# Patient Record
Sex: Female | Born: 1935 | Race: White | Hispanic: No | Marital: Married | Smoking: Never smoker
Health system: Southern US, Community
[De-identification: ages and names within clinical notes are randomized; demographics above are authoritative.]

## PROBLEM LIST (undated history)

## (undated) DIAGNOSIS — E079 Disorder of thyroid, unspecified: Secondary | ICD-10-CM

## (undated) HISTORY — PX: TONSILLECTOMY: SUR1361

---

## 1998-07-18 ENCOUNTER — Ambulatory Visit (HOSPITAL_COMMUNITY): Admission: RE | Admit: 1998-07-18 | Discharge: 1998-07-18 | Payer: Self-pay | Admitting: Dentistry

## 2001-04-12 ENCOUNTER — Other Ambulatory Visit: Admission: RE | Admit: 2001-04-12 | Discharge: 2001-04-12 | Payer: Self-pay | Admitting: Dermatology

## 2016-05-22 ENCOUNTER — Emergency Department (HOSPITAL_COMMUNITY): Payer: Medicare Other

## 2016-05-22 ENCOUNTER — Emergency Department (HOSPITAL_COMMUNITY)
Admission: EM | Admit: 2016-05-22 | Discharge: 2016-05-22 | Disposition: A | Discharge status: Home / Self Care | Payer: Medicare Other | Attending: Emergency Medicine | Admitting: Emergency Medicine

## 2016-05-22 ENCOUNTER — Encounter (HOSPITAL_COMMUNITY): Payer: Self-pay | Admitting: Emergency Medicine

## 2016-05-22 DIAGNOSIS — S060X0A Concussion without loss of consciousness, initial encounter: Secondary | ICD-10-CM | POA: Insufficient documentation

## 2016-05-22 DIAGNOSIS — Y929 Unspecified place or not applicable: Secondary | ICD-10-CM | POA: Diagnosis not present

## 2016-05-22 DIAGNOSIS — Z79899 Other long term (current) drug therapy: Secondary | ICD-10-CM | POA: Insufficient documentation

## 2016-05-22 DIAGNOSIS — W19XXXA Unspecified fall, initial encounter: Secondary | ICD-10-CM

## 2016-05-22 DIAGNOSIS — Y9389 Activity, other specified: Secondary | ICD-10-CM | POA: Diagnosis not present

## 2016-05-22 DIAGNOSIS — S0993XA Unspecified injury of face, initial encounter: Secondary | ICD-10-CM

## 2016-05-22 DIAGNOSIS — S0081XA Abrasion of other part of head, initial encounter: Secondary | ICD-10-CM | POA: Insufficient documentation

## 2016-05-22 DIAGNOSIS — S0990XA Unspecified injury of head, initial encounter: Secondary | ICD-10-CM | POA: Diagnosis present

## 2016-05-22 DIAGNOSIS — Y999 Unspecified external cause status: Secondary | ICD-10-CM | POA: Insufficient documentation

## 2016-05-22 DIAGNOSIS — W0110XA Fall on same level from slipping, tripping and stumbling with subsequent striking against unspecified object, initial encounter: Secondary | ICD-10-CM | POA: Insufficient documentation

## 2016-05-22 HISTORY — DX: Disorder of thyroid, unspecified: E07.9

## 2016-05-22 NOTE — ED Provider Notes (Signed)
CSN: [id number]     Arrival date & time 05/22/16  1816 History   First MD Initiated Contact with Patient 05/22/16 1819     Chief Complaint  Patient presents with  . Fall     Patient is a 82 y.o. female presenting with fall. The history is provided by the patient and a caregiver.  Fall This is a new problem. The current episode started 1 to 2 hours ago. The problem occurs constantly. The problem has been gradually improving. Associated symptoms include headaches. Pertinent negatives include no chest pain, no abdominal pain and no shortness of breath. Nothing aggravates the symptoms. Nothing relieves the symptoms.  PATIENT REPORTS SHE WAS OUTSIDE CHECKING RAIN METER WHEN SHE TRIPPED AND FELL HITTING LEFT SIDE OF FACE NO LOC SHE REPORTS FACIAL PAIN AND SOME HEADACHE NO VOMITING NO NECK OR BACK PAIN SHE REPORTS ABRASION TO LEFT SHOULDER AND LEFT WRIST NO OTHER PAIN COMPLAINTS SHE IS NOT ON ANTICOAGULANTS   Past Medical History  Diagnosis Date  . Thyroid disease    Past Surgical History  Procedure Laterality Date  . Tonsillectomy     No family history on file. Social History  Substance Use Topics  . Smoking status: Never Smoker   . Smokeless tobacco: None  . Alcohol Use: No   OB History    No data available     Review of Systems  Constitutional: Negative for fever and fatigue.  Respiratory: Negative for shortness of breath.   Cardiovascular: Negative for chest pain.  Gastrointestinal: Negative for abdominal pain.  Skin: Positive for wound.  Neurological: Positive for headaches.  All other systems reviewed and are negative.     Allergies  Review of patient's allergies indicates no known allergies.  Home Medications   Prior to Admission medications   Medication Sig Start Date End Date Taking? Authorizing Provider  levothyroxine (SYNTHROID, LEVOTHROID) 75 MCG tablet Take 75 mcg by mouth daily. 03/11/16  Yes Historical Provider, MD  OXYGEN Inhale 2 L into the lungs  at bedtime.   Yes Historical Provider, MD  Polyethyl Glycol-Propyl Glycol (SYSTANE) 0.4-0.3 % SOLN Place 1 drop into both eyes daily as needed. Dry eyes    Historical Provider, MD   BP 162/91 mmHg  Pulse 66  Temp(Src) 98.1 F (36.7 C)  Resp 18  Ht 5\' 3"  (1.6 m)  Wt 49.896 kg  BMI 19.49 kg/m2  SpO2 100% Physical Exam CONSTITUTIONAL: elderly but appears younger than stated age HEAD: Normocephalic/atraumatic EYES: EOMI/PERRL ENMT: Mucous membranes moist, abrasion/tenderness to left maxilla and left orbit.  No lacerations.  No stepoffs.  Mild swelling/abrasion to nose.  No septal hematoma.  No evidence of mandible injury.  No dental injury.  No stepoffs to face noted NECK: supple no meningeal signs SPINE/BACK:entire spine nontender CV: S1/S2 noted, no murmurs/rubs/gallops noted LUNGS: Lungs are clear to auscultation bilaterally ABDOMEN: soft, nontender NEURO: Pt is awake/alert/appropriate, moves all extremitiesx4.  No facial droop.  She ambulates without difficulty EXTREMITIES: pulses normal/equal, full ROM, abrasion to left shoulder and left wrist but no tenderness All other extremities/joints palpated/ranged and nontender SKIN: warm, color normal PSYCH: no abnormalities of mood noted, alert and oriented to situation  ED Course  Procedures  7:30 PM Pt declines tetanus booster 8:19 PM Ct imaging negative Pt awake/alert, no distress Will d/c home She lives alone but her caregivers check on her frequently and she is very independent  Imaging Review Ct Head Wo Contrast  05/22/2016  CLINICAL DATA:  Tripped and fell  in driveway, with left cheek pain and abrasion. Concern for head injury. Initial encounter. EXAM: CT HEAD WITHOUT CONTRAST CT MAXILLOFACIAL WITHOUT CONTRAST TECHNIQUE: Multidetector CT imaging of the head and maxillofacial structures were performed using the standard protocol without intravenous contrast. Multiplanar CT image reconstructions of the maxillofacial structures  were also generated. COMPARISON:  None. FINDINGS: CT HEAD FINDINGS There is no evidence of acute infarction, mass lesion, or intra- or extra-axial hemorrhage on CT. Prominence of the ventricles and sulci reflects mild to moderate cortical volume loss. Mild periventricular white matter change likely reflects small vessel ischemic microangiopathy. A small chronic lacunar infarct is noted at the right cerebellar hemisphere. The brainstem and fourth ventricle are within normal limits. The basal ganglia are unremarkable in appearance. The cerebral hemispheres demonstrate grossly normal gray-white differentiation. No mass effect or midline shift is seen. There is no evidence of fracture; visualized osseous structures are unremarkable in appearance. The orbits are within normal limits. The paranasal sinuses and mastoid air cells are well-aerated. No significant soft tissue abnormalities are seen. CT MAXILLOFACIAL FINDINGS There is no evidence of fracture or dislocation. The maxilla and mandible appear intact. The nasal bone is unremarkable in appearance. The visualized dentition demonstrates no acute abnormality. There is flattening of the mandibular condylar heads bilaterally, reflecting chronic temporomandibular joint disease. The orbits are intact bilaterally. The visualized paranasal sinuses and mastoid air cells are well-aerated. Minimal soft tissue swelling is noted overlying the left maxilla. The parapharyngeal fat planes are preserved. The nasopharynx, oropharynx and hypopharynx are unremarkable in appearance. The visualized portions of the valleculae and piriform sinuses are grossly unremarkable. The parotid and submandibular glands are within normal limits. No cervical lymphadenopathy is seen. IMPRESSION: 1. No evidence of traumatic intracranial injury or fracture. 2. No evidence of fracture or dislocation with regard to the maxillofacial structures. 3. Minimal soft tissue swelling overlying the left maxilla. 4.  Mild to moderate cortical volume loss and scattered small vessel ischemic microangiopathy. 5. Small chronic lacunar infarct at the right cerebellar hemisphere. 6. Flattening of the mandibular condylar heads bilaterally, reflecting chronic temporomandibular joint disease. Electronically Signed   By: Roanna Raider M.D.   On: 05/22/2016 20:15   Ct Maxillofacial Wo Cm  05/22/2016  CLINICAL DATA:  Tripped and fell in driveway, with left cheek pain and abrasion. Concern for head injury. Initial encounter. EXAM: CT HEAD WITHOUT CONTRAST CT MAXILLOFACIAL WITHOUT CONTRAST TECHNIQUE: Multidetector CT imaging of the head and maxillofacial structures were performed using the standard protocol without intravenous contrast. Multiplanar CT image reconstructions of the maxillofacial structures were also generated. COMPARISON:  None. FINDINGS: CT HEAD FINDINGS There is no evidence of acute infarction, mass lesion, or intra- or extra-axial hemorrhage on CT. Prominence of the ventricles and sulci reflects mild to moderate cortical volume loss. Mild periventricular white matter change likely reflects small vessel ischemic microangiopathy. A small chronic lacunar infarct is noted at the right cerebellar hemisphere. The brainstem and fourth ventricle are within normal limits. The basal ganglia are unremarkable in appearance. The cerebral hemispheres demonstrate grossly normal gray-white differentiation. No mass effect or midline shift is seen. There is no evidence of fracture; visualized osseous structures are unremarkable in appearance. The orbits are within normal limits. The paranasal sinuses and mastoid air cells are well-aerated. No significant soft tissue abnormalities are seen. CT MAXILLOFACIAL FINDINGS There is no evidence of fracture or dislocation. The maxilla and mandible appear intact. The nasal bone is unremarkable in appearance. The visualized dentition demonstrates no acute abnormality. There  is flattening of the  mandibular condylar heads bilaterally, reflecting chronic temporomandibular joint disease. The orbits are intact bilaterally. The visualized paranasal sinuses and mastoid air cells are well-aerated. Minimal soft tissue swelling is noted overlying the left maxilla. The parapharyngeal fat planes are preserved. The nasopharynx, oropharynx and hypopharynx are unremarkable in appearance. The visualized portions of the valleculae and piriform sinuses are grossly unremarkable. The parotid and submandibular glands are within normal limits. No cervical lymphadenopathy is seen. IMPRESSION: 1. No evidence of traumatic intracranial injury or fracture. 2. No evidence of fracture or dislocation with regard to the maxillofacial structures. 3. Minimal soft tissue swelling overlying the left maxilla. 4. Mild to moderate cortical volume loss and scattered small vessel ischemic microangiopathy. 5. Small chronic lacunar infarct at the right cerebellar hemisphere. 6. Flattening of the mandibular condylar heads bilaterally, reflecting chronic temporomandibular joint disease. Electronically Signed   By: Roanna Raider M.D.   On: 05/22/2016 20:15      MDM   Final diagnoses:  Concussion, without loss of consciousness, initial encounter  Blunt trauma of face, initial encounter  Abrasion of face, initial encounter    Nursing notes including past medical history and social history reviewed and considered in documentation     Zadie Rhine, MD 05/22/16 2020

## 2016-05-22 NOTE — ED Notes (Signed)
MD at bedside. 

## 2016-05-22 NOTE — ED Notes (Addendum)
PT tripped and fell in driveway this afternoon. Denies loc. C/o pain to left cheek and left wrist, abrassions noted.

## 2016-05-22 NOTE — Discharge Instructions (Signed)
Abrasion An abrasion is a cut or scrape on the outer surface of your skin. An abrasion does not extend through all of the layers of your skin. It is important to care for your abrasion properly to prevent infection. CAUSES Most abrasions are caused by falling on or gliding across the ground or another surface. When your skin rubs on something, the outer and inner layer of skin rubs off.  SYMPTOMS A cut or scrape is the main symptom of this condition. The scrape may be bleeding, or it may appear red or pink. If there was an associated fall, there may be an underlying bruise. DIAGNOSIS An abrasion is diagnosed with a physical exam. TREATMENT Treatment for this condition depends on how large and deep the abrasion is. Usually, your abrasion will be cleaned with water and mild soap. This removes any dirt or debris that may be stuck. An antibiotic ointment may be applied to the abrasion to help prevent infection. A bandage (dressing) may be placed on the abrasion to keep it clean. You may also need a tetanus shot. HOME CARE INSTRUCTIONS Medicines  Take or apply medicines only as directed by your health care provider.  If you were prescribed an antibiotic ointment, finish all of it even if you start to feel better. Wound Care  Clean the wound with mild soap and water 2-3 times per day or as directed by your health care provider. Pat your wound dry with a clean towel. Do not rub it.  There are many different ways to close and cover a wound. Follow instructions from your health care provider about:  Wound care.  Dressing changes and removal.  Check your wound every day for signs of infection. Watch for:  Redness, swelling, or pain.  Fluid, blood, or pus. General Instructions  Keep the dressing dry as directed by your health care provider. Do not take baths, swim, use a hot tub, or do anything that would put your wound underwater until your health care provider approves.  If there is  swelling, raise (elevate) the injured area above the level of your heart while you are sitting or lying down.  Keep all follow-up visits as directed by your health care provider. This is important. SEEK MEDICAL CARE IF:  You received a tetanus shot and you have swelling, severe pain, redness, or bleeding at the injection site.  Your pain is not controlled with medicine.  You have increased redness, swelling, or pain at the site of your wound. SEEK IMMEDIATE MEDICAL CARE IF:  You have a red streak going away from your wound.  You have a fever.  You have fluid, blood, or pus coming from your wound.  You notice a bad smell coming from your wound or your dressing.   This information is not intended to replace advice given to you by your health care provider. Make sure you discuss any questions you have with your health care provider.   Document Released: 09/24/2005 Document Revised: 09/05/2015 Document Reviewed: 12/13/2014 Elsevier Interactive Patient Education 2016 [employer].  Concussion, Adult A concussion, or closed-head injury, is a brain injury caused by a direct blow to the head or by a quick and sudden movement (jolt) of the head or neck. Concussions are usually not life-threatening. Even so, the effects of a concussion can be serious. If you have had a concussion before, you are more likely to experience concussion-like symptoms after a direct blow to the head.  CAUSES  Direct blow to the head,  such as from running into another player during a soccer game, being hit in a fight, or hitting your head on a hard surface.  A jolt of the head or neck that causes the brain to move back and forth inside the skull, such as in a car crash. SIGNS AND SYMPTOMS The signs of a concussion can be hard to notice. Early on, they may be missed by you, family members, and health care providers. You may look fine but act or feel differently. Symptoms are usually temporary, but they may last for  days, weeks, or even longer. Some symptoms may appear right away while others may not show up for hours or days. Every head injury is different. Symptoms include:  Mild to moderate headaches that will not go away.  A feeling of pressure inside your head.  Having more trouble than usual:  Learning or remembering things you have heard.  Answering questions.  Paying attention or concentrating.  Organizing daily tasks.  Making decisions and solving problems.  Slowness in thinking, acting or reacting, speaking, or reading.  Getting lost or being easily confused.  Feeling tired all the time or lacking energy (fatigued).  Feeling drowsy.  Sleep disturbances.  Sleeping more than usual.  Sleeping less than usual.  Trouble falling asleep.  Trouble sleeping (insomnia).  Loss of balance or feeling lightheaded or dizzy.  Nausea or vomiting.  Numbness or tingling.  Increased sensitivity to:  Sounds.  Lights.  Distractions.  Vision problems or eyes that tire easily.  Diminished sense of taste or smell.  Ringing in the ears.  Mood changes such as feeling sad or anxious.  Becoming easily irritated or angry for little or no reason.  Lack of motivation.  Seeing or hearing things other people do not see or hear (hallucinations). DIAGNOSIS Your health care provider can usually diagnose a concussion based on a description of your injury and symptoms. He or she will ask whether you passed out (lost consciousness) and whether you are having trouble remembering events that happened right before and during your injury. Your evaluation might include:  A brain scan to look for signs of injury to the brain. Even if the test shows no injury, you may still have a concussion.  Blood tests to be sure other problems are not present. TREATMENT  Concussions are usually treated in an emergency department, in urgent care, or at a clinic. You may need to stay in the hospital  overnight for further treatment.  Tell your health care provider if you are taking any medicines, including prescription medicines, over-the-counter medicines, and natural remedies. Some medicines, such as blood thinners (anticoagulants) and aspirin, may increase the chance of complications. Also tell your health care provider whether you have had alcohol or are taking illegal drugs. This information may affect treatment.  Your health care provider will send you home with important instructions to follow.  How fast you will recover from a concussion depends on many factors. These factors include how severe your concussion is, what part of your brain was injured, your age, and how healthy you were before the concussion.  Most people with mild injuries recover fully. Recovery can take time. In general, recovery is slower in older persons. Also, persons who have had a concussion in the past or have other medical problems may find that it takes longer to recover from their current injury. HOME CARE INSTRUCTIONS General Instructions  Carefully follow the directions your health care provider gave you.  Only take  over-the-counter or prescription medicines for pain, discomfort, or fever as directed by your health care provider.  Take only those medicines that your health care provider has approved.  Do not drink alcohol until your health care provider says you are well enough to do so. Alcohol and certain other drugs may slow your recovery and can put you at risk of further injury.  If it is harder than usual to remember things, write them down.  If you are easily distracted, try to do one thing at a time. For example, do not try to watch TV while fixing dinner.  Talk with family members or close friends when making important decisions.  Keep all follow-up appointments. Repeated evaluation of your symptoms is recommended for your recovery.  Watch your symptoms and tell others to do the same.  Complications sometimes occur after a concussion. Older adults with a brain injury may have a higher risk of serious complications, such as a blood clot on the brain.  Tell your teachers, school nurse, school counselor, coach, athletic trainer, or work Freight forwarder about your injury, symptoms, and restrictions. Tell them about what you can or cannot do. They should watch for:  Increased problems with attention or concentration.  Increased difficulty remembering or learning new information.  Increased time needed to complete tasks or assignments.  Increased irritability or decreased ability to cope with stress.  Increased symptoms.  Rest. Rest helps the brain to heal. Make sure you:  Get plenty of sleep at night. Avoid staying up late at night.  Keep the same bedtime hours on weekends and weekdays.  Rest during the day. Take daytime naps or rest breaks when you feel tired.  Limit activities that require a lot of thought or concentration. These include:  Doing homework or job-related work.  Watching TV.  Working on the computer.  Avoid any situation where there is potential for another head injury (football, hockey, soccer, basketball, martial arts, downhill snow sports and horseback riding). Your condition will get worse every time you experience a concussion. You should avoid these activities until you are evaluated by the appropriate follow-up health care providers. Returning To Your Regular Activities You will need to return to your normal activities slowly, not all at once. You must give your body and brain enough time for recovery.  Do not return to sports or other athletic activities until your health care provider tells you it is safe to do so.  Ask your health care provider when you can drive, ride a bicycle, or operate heavy machinery. Your ability to react may be slower after a brain injury. Never do these activities if you are dizzy.  Ask your health care provider about  when you can return to work or school. Preventing Another Concussion It is very important to avoid another brain injury, especially before you have recovered. In rare cases, another injury can lead to permanent brain damage, brain swelling, or death. The risk of this is greatest during the first 7-10 days after a head injury. Avoid injuries by:  Wearing a seat belt when riding in a car.  Drinking alcohol only in moderation.  Wearing a helmet when biking, skiing, skateboarding, skating, or doing similar activities.  Avoiding activities that could lead to a second concussion, such as contact or recreational sports, until your health care provider says it is okay.  Taking safety measures in your home.  Remove clutter and tripping hazards from floors and stairways.  Use grab bars in bathrooms and handrails  by stairs.  Place non-slip mats on floors and in bathtubs.  Improve lighting in dim areas. SEEK MEDICAL CARE IF:  You have increased problems paying attention or concentrating.  You have increased difficulty remembering or learning new information.  You need more time to complete tasks or assignments than before.  You have increased irritability or decreased ability to cope with stress.  You have more symptoms than before. Seek medical care if you have any of the following symptoms for more than 2 weeks after your injury:  Lasting (chronic) headaches.  Dizziness or balance problems.  Nausea.  Vision problems.  Increased sensitivity to noise or light.  Depression or mood swings.  Anxiety or irritability.  Memory problems.  Difficulty concentrating or paying attention.  Sleep problems.  Feeling tired all the time. SEEK IMMEDIATE MEDICAL CARE IF:  You have severe or worsening headaches. These may be a sign of a blood clot in the brain.  You have weakness (even if only in one hand, leg, or part of the face).  You have numbness.  You have decreased  coordination.  You vomit repeatedly.  You have increased sleepiness.  One pupil is larger than the other.  You have convulsions.  You have slurred speech.  You have increased confusion. This may be a sign of a blood clot in the brain.  You have increased restlessness, agitation, or irritability.  You are unable to recognize people or places.  You have neck pain.  It is difficult to wake you up.  You have unusual behavior changes.  You lose consciousness. MAKE SURE YOU:  Understand these instructions.  Will watch your condition.  Will get help right away if you are not doing well or get worse.   This information is not intended to replace advice given to you by your health care provider. Make sure you discuss any questions you have with your health care provider.   Document Released: 03/06/2004 Document Revised: 01/05/2015 Document Reviewed: 07/07/2013 Elsevier Interactive Patient Education [employer]2016 [employer] Inc.

## 2017-04-16 ENCOUNTER — Other Ambulatory Visit (HOSPITAL_COMMUNITY): Payer: Self-pay | Admitting: *Deleted

## 2017-04-16 ENCOUNTER — Ambulatory Visit (HOSPITAL_COMMUNITY)
Admission: RE | Admit: 2017-04-16 | Discharge: 2017-04-16 | Disposition: A | Discharge status: Home / Self Care | Payer: Medicare Other | Source: Ambulatory Visit | Attending: *Deleted | Admitting: *Deleted

## 2017-04-16 DIAGNOSIS — R609 Edema, unspecified: Secondary | ICD-10-CM

## 2017-04-16 DIAGNOSIS — M7989 Other specified soft tissue disorders: Secondary | ICD-10-CM | POA: Insufficient documentation

## 2017-05-09 ENCOUNTER — Encounter (HOSPITAL_COMMUNITY): Payer: Self-pay | Admitting: Emergency Medicine

## 2017-05-09 ENCOUNTER — Emergency Department (HOSPITAL_COMMUNITY): Payer: Medicare Other

## 2017-05-09 ENCOUNTER — Emergency Department (HOSPITAL_COMMUNITY)
Admission: EM | Admit: 2017-05-09 | Discharge: 2017-05-09 | Disposition: A | Discharge status: Home / Self Care | Payer: Medicare Other | Attending: Emergency Medicine | Admitting: Emergency Medicine

## 2017-05-09 DIAGNOSIS — S0990XA Unspecified injury of head, initial encounter: Secondary | ICD-10-CM | POA: Diagnosis not present

## 2017-05-09 DIAGNOSIS — S0181XA Laceration without foreign body of other part of head, initial encounter: Secondary | ICD-10-CM | POA: Diagnosis not present

## 2017-05-09 DIAGNOSIS — S0993XA Unspecified injury of face, initial encounter: Secondary | ICD-10-CM | POA: Diagnosis present

## 2017-05-09 DIAGNOSIS — Z23 Encounter for immunization: Secondary | ICD-10-CM | POA: Insufficient documentation

## 2017-05-09 DIAGNOSIS — Y939 Activity, unspecified: Secondary | ICD-10-CM | POA: Insufficient documentation

## 2017-05-09 DIAGNOSIS — W01198A Fall on same level from slipping, tripping and stumbling with subsequent striking against other object, initial encounter: Secondary | ICD-10-CM | POA: Diagnosis not present

## 2017-05-09 DIAGNOSIS — S80812A Abrasion, left lower leg, initial encounter: Secondary | ICD-10-CM | POA: Diagnosis not present

## 2017-05-09 DIAGNOSIS — Y92007 Garden or yard of unspecified non-institutional (private) residence as the place of occurrence of the external cause: Secondary | ICD-10-CM | POA: Insufficient documentation

## 2017-05-09 DIAGNOSIS — Y999 Unspecified external cause status: Secondary | ICD-10-CM | POA: Insufficient documentation

## 2017-05-09 DIAGNOSIS — S80811A Abrasion, right lower leg, initial encounter: Secondary | ICD-10-CM

## 2017-05-09 DIAGNOSIS — Z79899 Other long term (current) drug therapy: Secondary | ICD-10-CM | POA: Insufficient documentation

## 2017-05-09 DIAGNOSIS — W19XXXA Unspecified fall, initial encounter: Secondary | ICD-10-CM

## 2017-05-09 DIAGNOSIS — Y92009 Unspecified place in unspecified non-institutional (private) residence as the place of occurrence of the external cause: Secondary | ICD-10-CM

## 2017-05-09 MED ORDER — ACETAMINOPHEN 325 MG PO TABS
650.0000 mg | ORAL_TABLET | Freq: Once | ORAL | Status: AC
  Administered 2017-05-09: 650 mg via ORAL
  Filled 2017-05-09: qty 2

## 2017-05-09 MED ORDER — TETANUS-DIPHTH-ACELL PERTUSSIS 5-2.5-18.5 LF-MCG/0.5 IM SUSP
0.5000 mL | Freq: Once | INTRAMUSCULAR | Status: AC
Start: 2017-05-09 — End: 2017-05-09
  Administered 2017-05-09: 0.5 mL via INTRAMUSCULAR
  Filled 2017-05-09: qty 0.5

## 2017-05-09 NOTE — ED Notes (Signed)
Awaiting disposition- pt has removed herself from VS and is eating crackers and sipping water

## 2017-05-09 NOTE — Discharge Instructions (Signed)
Take over the counter tylenol, as directed on packaging, as needed for discomfort.  Apply moist heat or ice to the area(s) of discomfort, for 15 minutes at a time, several times per day for the next few days.  Do not fall asleep on a heating or ice pack. Do not soak or pick at the "wound glue" on your chin, it will "fall off" on its own.  Gently wash the abraded areas on your legs with soap and water at least twice a day, and cover with a clean/dry, non-stick dressing.  Change the dressing whenever it becomes wet or soiled after washing the area with soap and water.  Call your regular medical doctor on Monday to schedule a follow up appointment for a recheck within the next 2 days.  Return to the Emergency Department immediately if worsening.

## 2017-05-09 NOTE — ED Notes (Signed)
Pt li9ves alone and was going into her home, tripped and fell Now, with tears to bilateral lower legs Also an abrasion to her lower R jaw She complains of neck pain, jaw pain as well as stating that she believes that her R lower leg is broken There is no deformity noted to her lower legs

## 2017-05-09 NOTE — ED Notes (Signed)
To CT

## 2017-05-09 NOTE — ED Notes (Signed)
From radiology 

## 2017-05-09 NOTE — ED Notes (Signed)
Dr Mike GipMcM in to reassess and repair chin wound

## 2017-05-09 NOTE — ED Provider Notes (Signed)
[facility] Provider Note   CSN: [id number] Arrival date & time: 05/09/17  1325     History   Chief Complaint Chief Complaint  Patient presents with  . Fall    HPI Lashunta Frieden is a 83 y.o. female.  HPI  Pt was seen at 1405. Per pt and her friend, c/o sudden onset and resolution of one episode of trip and fall that occurred in her yard PTA. Pt states she tripped over a planter and fell forward. Pt c/o right ankle pain, abrasions to her right jaw and bilat anterior lower legs. Pt stood herself up and was ambulatory after the fall. Denies prodromal symptoms before fall. Denies LOC, no AMS, no back pain, no CP/palpitations, no SOB/cough, no abd pain, no N/V/D, no focal motor weakness, no tingling/numbness in extremities.   Past Medical History:  Diagnosis Date  . Thyroid disease     There are no active problems to display for this patient.   Past Surgical History:  Procedure Laterality Date  . TONSILLECTOMY      OB History    Gravida Para Term Preterm AB Living             0   SAB TAB Ectopic Multiple Live Births                   Home Medications    Prior to Admission medications   Medication Sig Start Date End Date Taking? Authorizing Provider  levothyroxine (SYNTHROID, LEVOTHROID) 75 MCG tablet Take 75 mcg by mouth daily. 03/11/16  Yes [provider]  OXYGEN Inhale 2 L into the lungs at bedtime.   Yes [provider]  Polyethyl Glycol-Propyl Glycol (SYSTANE) 0.4-0.3 % SOLN Place 1 drop into both eyes daily as needed. Dry eyes   Yes [provider]    Family History History reviewed. No pertinent family history.  Social History Social History  Substance Use Topics  . Smoking status: Never Smoker  . Smokeless tobacco: Never Used  . Alcohol use No     Allergies   Patient has no known allergies.   Review of Systems Review of Systems ROS: Statement: All systems negative except as marked or noted in the HPI;  Constitutional: Negative for fever and chills. ; ; Eyes: Negative for eye pain, redness and discharge. ; ; ENMT: Negative for ear pain, hoarseness, nasal congestion, sinus pressure and sore throat. ; ; Cardiovascular: Negative for chest pain, palpitations, diaphoresis, dyspnea and peripheral edema. ; ; Respiratory: Negative for cough, wheezing and stridor. ; ; Gastrointestinal: Negative for nausea, vomiting, diarrhea, abdominal pain, blood in stool, hematemesis, jaundice and rectal bleeding. . ; ; Genitourinary: Negative for dysuria, flank pain and hematuria. ; ; Musculoskeletal: Negative for back pain and neck pain. +jaw pain, right ankle pain.; ; Skin: +abrasions. Negative for pruritus, rash, blisters, bruising and skin lesion.; ; Neuro: Negative for headache, lightheadedness and neck stiffness. Negative for weakness, altered level of consciousness, altered mental status, extremity weakness, paresthesias, involuntary movement, seizure and syncope.      Physical Exam Updated Vital Signs BP 140/81 (BP Location: Left Arm)   Pulse 68   Temp 98.2 F (36.8 C) (Oral)   Resp 18   Ht 5\' 3"  (1.6 m)   Wt 117 lb (53.1 kg)   SpO2 98%   BMI 20.73 kg/m   Physical Exam 1410: Physical examination: Vital signs and O2 SAT: Reviewed; Constitutional: Well developed, Well nourished, Well hydrated, In no acute distress; Head and  Face: Normocephalic, Atraumatic. +small superficial abrasion to right chin, no ecchymosis.; Eyes: EOMI, PERRL, No scleral icterus; ENMT: Mouth and pharynx normal, Left TM normal, Right TM normal, Mucous membranes moist. Mouth and pharynx without lesions. No intra-oral edema. No submandibular or sublingual edema. No hoarse voice, no drooling, no stridor. No trismus.; Neck: Supple, Trachea midline; Spine: No midline CS, TS, LS tenderness.; Cardiovascular: Regular rate and rhythm, No gallop; Respiratory: Breath sounds clear & equal bilaterally, No wheezes, Normal respiratory effort/excursion;  Chest: Nontender, No deformity, Movement normal, No crepitus, No abrasions or ecchymosis.; Abdomen: Soft, Nontender, Nondistended, Normal bowel sounds, No abrasions or ecchymosis.; Genitourinary: No CVA tenderness;; Extremities: No deformity, Full range of motion major/large joints of bilat UE's and LE's without pain or tenderness to palp, Neurovascularly intact, Pulses normal, No tenderness bilat UE's, LLE. NT right hip/knee/foot. +very mild generalized TTP right ankle, no edema, no deformity, no ecchymosis. +large superficial abrasions to bilat anterior tibial areas. No edema, Pelvis stable; Neuro: AA&Ox3, GCS 15.  Major CN grossly intact. Speech clear. No gross focal motor or sensory deficits in extremities.; Skin: Color normal, Warm, Dry   ED Treatments / Results  Labs (all labs ordered are listed, but only abnormal results are displayed)   EKG  EKG Interpretation None       Radiology   Procedures Procedures (including critical care time)  LACERATION REPAIR Performed by: Laray AngerMCMANUS,Jeffre Enriques M Authorized by: Laray AngerMCMANUS,Meosha Castanon M Consent: Verbal consent obtained. Risks and benefits: risks, benefits and alternatives were discussed Consent given by: patient Patient identity confirmed: provided demographic data Prepped and Draped in normal sterile fashion Wound explored Laceration Location: right chin Laceration Length: 0.5cm No Foreign Bodies seen or palpated Anesthesia: none Irrigation method: syringe Amount of cleaning: standard Skin closure: dermabond Patient tolerance: Patient tolerated the procedure well with no immediate complications.   Medications Ordered in ED Medications  Tdap (BOOSTRIX) injection 0.5 mL (0.5 mLs Intramuscular Given 05/09/17 1448)     Initial Impression / Assessment and Plan / ED Course  I have reviewed the triage vital signs and the nursing notes.  Pertinent labs & imaging results that were available during my care of the patient were reviewed by  me and considered in my medical decision making (see chart for details).  MDM Reviewed: previous chart, nursing note and vitals Interpretation: x-ray and CT scan   Dg Tibia/fibula Right Result Date: 05/09/2017 CLINICAL DATA:  Larey SeatFell in yard working with flowers this morning, both lower legs scraped at distal lower leg area, most pain with right lower leg EXAM: RIGHT TIBIA AND FIBULA - 2 VIEW COMPARISON:  None. FINDINGS: Overlying bandages in place. Osseous structures appear normally aligned. No fracture line or displaced fracture fragment seen. No foreign body appreciated within the overlying soft tissues. IMPRESSION: No osseous fracture or dislocation. Overlying bandages in place. No foreign body seen within the soft tissues. Electronically Signed   By: Bary RichardStan  Maynard M.D.   On: 05/09/2017 14:08   Dg Ankle Complete Right Result Date: 05/09/2017 CLINICAL DATA:  Fall, pain, abrasions along the overlying EXAM: RIGHT ANKLE - COMPLETE 3+ VIEW COMPARISON:  None. FINDINGS: No fracture or dislocation is seen. The ankle mortise is intact. Mild soft tissue swelling along the inferolateral ankle. Soft tissue laceration overlying the dorsal ankle. No radiopaque foreign body is seen. IMPRESSION: Soft tissue laceration overlying the dorsal ankle. No radiopaque foreign body is seen. No fracture or dislocation is seen. Electronically Signed   By: Charline BillsSriyesh  Krishnan M.D.   On: 05/09/2017 15:28  Ct Head Wo Contrast Result Date: 05/09/2017 CLINICAL DATA:  83 year old female with head, face and neck injury following fall. Initial encounter. EXAM: CT HEAD WITHOUT CONTRAST CT MAXILLOFACIAL WITHOUT CONTRAST CT CERVICAL SPINE WITHOUT CONTRAST TECHNIQUE: Multidetector CT imaging of the head, cervical spine, and maxillofacial structures were performed using the standard protocol without intravenous contrast. Multiplanar CT image reconstructions of the cervical spine and maxillofacial structures were also generated. COMPARISON:   05/22/2016 and 03/06/2008 CTs FINDINGS: CT HEAD FINDINGS Brain: No evidence of acute infarction, hemorrhage, hydrocephalus, extra-axial collection or mass lesion/mass effect. Atrophy and chronic small-vessel white matter ischemic changes again noted. Vascular: Intracranial atherosclerotic calcifications noted. Skull: No acute abnormalities. Other: None CT MAXILLOFACIAL FINDINGS Osseous: No acute fracture, subluxation or dislocation. Degenerative changes of the TMJ is noted. Orbits: Negative. No traumatic or inflammatory finding. Sinuses: Clear. Soft tissues: Mild soft tissue swelling overlying the chin noted. CT CERVICAL SPINE FINDINGS Alignment: No acute subluxation. Skull base and vertebrae: No acute fracture. No primary bone lesion or focal pathologic process. Soft tissues and spinal canal: No prevertebral fluid or swelling. No visible canal hematoma. Disc levels: Multilevel degenerative disc disease and spondylosis noted, moderate at C3-4, C5-6 and C6-7. Upper chest: Negative. Other: None IMPRESSION: No evidence of acute intracranial abnormality. Atrophy and chronic small-vessel white matter ischemic changes. Chin soft tissue swelling without acute facial fracture. No static evidence of acute injury to the cervical spine. Degenerative changes as described. Electronically Signed   By: Harmon Pier M.D.   On: 05/09/2017 16:00   Ct Cervical Spine Wo Contrast Result Date: 05/09/2017 CLINICAL DATA:  83 year old female with head, face and neck injury following fall. Initial encounter. EXAM: CT HEAD WITHOUT CONTRAST CT MAXILLOFACIAL WITHOUT CONTRAST CT CERVICAL SPINE WITHOUT CONTRAST TECHNIQUE: Multidetector CT imaging of the head, cervical spine, and maxillofacial structures were performed using the standard protocol without intravenous contrast. Multiplanar CT image reconstructions of the cervical spine and maxillofacial structures were also generated. COMPARISON:  05/22/2016 and 03/06/2008 CTs FINDINGS: CT HEAD  FINDINGS Brain: No evidence of acute infarction, hemorrhage, hydrocephalus, extra-axial collection or mass lesion/mass effect. Atrophy and chronic small-vessel white matter ischemic changes again noted. Vascular: Intracranial atherosclerotic calcifications noted. Skull: No acute abnormalities. Other: None CT MAXILLOFACIAL FINDINGS Osseous: No acute fracture, subluxation or dislocation. Degenerative changes of the TMJ is noted. Orbits: Negative. No traumatic or inflammatory finding. Sinuses: Clear. Soft tissues: Mild soft tissue swelling overlying the chin noted. CT CERVICAL SPINE FINDINGS Alignment: No acute subluxation. Skull base and vertebrae: No acute fracture. No primary bone lesion or focal pathologic process. Soft tissues and spinal canal: No prevertebral fluid or swelling. No visible canal hematoma. Disc levels: Multilevel degenerative disc disease and spondylosis noted, moderate at C3-4, C5-6 and C6-7. Upper chest: Negative. Other: None IMPRESSION: No evidence of acute intracranial abnormality. Atrophy and chronic small-vessel white matter ischemic changes. Chin soft tissue swelling without acute facial fracture. No static evidence of acute injury to the cervical spine. Degenerative changes as described. Electronically Signed   By: Harmon Pier M.D.   On: 05/09/2017 16:00   Ct Maxillofacial Wo Cm Result Date: 05/09/2017 CLINICAL DATA:  83 year old female with head, face and neck injury following fall. Initial encounter. EXAM: CT HEAD WITHOUT CONTRAST CT MAXILLOFACIAL WITHOUT CONTRAST CT CERVICAL SPINE WITHOUT CONTRAST TECHNIQUE: Multidetector CT imaging of the head, cervical spine, and maxillofacial structures were performed using the standard protocol without intravenous contrast. Multiplanar CT image reconstructions of the cervical spine and maxillofacial structures were also generated. COMPARISON:  05/22/2016 and 03/06/2008 CTs FINDINGS: CT HEAD FINDINGS Brain: No evidence of acute infarction,  hemorrhage, hydrocephalus, extra-axial collection or mass lesion/mass effect. Atrophy and chronic small-vessel white matter ischemic changes again noted. Vascular: Intracranial atherosclerotic calcifications noted. Skull: No acute abnormalities. Other: None CT MAXILLOFACIAL FINDINGS Osseous: No acute fracture, subluxation or dislocation. Degenerative changes of the TMJ is noted. Orbits: Negative. No traumatic or inflammatory finding. Sinuses: Clear. Soft tissues: Mild soft tissue swelling overlying the chin noted. CT CERVICAL SPINE FINDINGS Alignment: No acute subluxation. Skull base and vertebrae: No acute fracture. No primary bone lesion or focal pathologic process. Soft tissues and spinal canal: No prevertebral fluid or swelling. No visible canal hematoma. Disc levels: Multilevel degenerative disc disease and spondylosis noted, moderate at C3-4, C5-6 and C6-7. Upper chest: Negative. Other: None IMPRESSION: No evidence of acute intracranial abnormality. Atrophy and chronic small-vessel white matter ischemic changes. Chin soft tissue swelling without acute facial fracture. No static evidence of acute injury to the cervical spine. Degenerative changes as described. Electronically Signed   By: Harmon Pier M.D.   On: 05/09/2017 16:00    1700:   Small chin lac repaired with dermabond. DSD applied to bilat LE's abrasions (not amenable to sutures/etc). Pt has tol PO well without N/V. Pt has ambulated with steady gait. Pt states she is ready to go home now. Dx and testing d/w pt and family.  Questions answered.  Verb understanding, agreeable to d/c home with outpt f/u.   Final Clinical Impressions(s) / ED Diagnoses   Final diagnoses:  None    New Prescriptions New Prescriptions   No medications on file     Samuel Jester, DO 05/13/17 1610

## 2017-05-09 NOTE — ED Notes (Signed)
Wound cleansed  and dressed with Xeroform and 4 in kling to both lower legs

## 2017-05-09 NOTE — ED Notes (Signed)
To radiology

## 2017-05-09 NOTE — ED Notes (Signed)
Crackers and water provided. 

## 2017-05-09 NOTE — ED Triage Notes (Signed)
PT states she outside in her yard and tripped over a planter hitting her bilateral lower legs and hit the ground and obtained a small laceration to her chin. PT denies any LOC and walked back into her house to call her friends to get a ride to the ED.

## 2017-05-25 ENCOUNTER — Emergency Department (HOSPITAL_COMMUNITY)
Admission: EM | Admit: 2017-05-25 | Discharge: 2017-05-25 | Disposition: A | Discharge status: Home / Self Care | Payer: Medicare Other | Attending: Emergency Medicine | Admitting: Emergency Medicine

## 2017-05-25 ENCOUNTER — Encounter (HOSPITAL_COMMUNITY): Payer: Self-pay

## 2017-05-25 DIAGNOSIS — Z4801 Encounter for change or removal of surgical wound dressing: Secondary | ICD-10-CM | POA: Diagnosis present

## 2017-05-25 DIAGNOSIS — L03115 Cellulitis of right lower limb: Secondary | ICD-10-CM | POA: Diagnosis not present

## 2017-05-25 MED ORDER — DOXYCYCLINE HYCLATE 100 MG PO CAPS: 100.0000 mg | ORAL_CAPSULE | Freq: Two times a day (BID) | ORAL | 0 refills | Status: AC

## 2017-05-25 MED ORDER — DOXYCYCLINE HYCLATE 100 MG PO TABS
100.0000 mg | ORAL_TABLET | Freq: Once | ORAL | Status: AC
  Administered 2017-05-25: 100 mg via ORAL
  Filled 2017-05-25: qty 1

## 2017-05-25 NOTE — ED Triage Notes (Signed)
Here for wound recheck. States her pcp Dr. Charm BargesButler is out of office and patient needed a dressing change. Patient also has redness noted with swelling to right lower leg/foot that has worsened per family.

## 2017-05-25 NOTE — ED Provider Notes (Signed)
[facility] Provider Note   CSN: [id number] Arrival date & time: 05/25/17  1042  By signing my name below, I, Deland Pretty, attest that this documentation has been prepared under the direction and in the presence of Blane Ohara, MD. Electronically Signed: Deland Pretty, ED Scribe. 05/25/17. 2:07 PM.  History   Chief Complaint Chief Complaint  Patient presents with  . Wound Check   The history is provided by the patient. No language interpreter was used.   HPI Comments: Julia Walker is a 83 y.o. female who presents to the Emergency Department complaining of gradually worsening right lower leg pain with accompanying wound s/p mechanical fall that occurred a few days ago. Pt also has associated redness and swelling to the site. Pt denies head injury and syncope. She says that her PCP prescribed antibiotics, that she is currently taking. Because of her worsening symptoms she went to her PCP but their office was not open due to holiday. The pt also requests a dressing change. Relative reports that she lives by herself. The pt denies fracture to the site and a PMHx of DM. She also denies vomiting. No known allergies   Past Medical History:  Diagnosis Date  . Thyroid disease     There are no active problems to display for this patient.   Past Surgical History:  Procedure Laterality Date  . TONSILLECTOMY      OB History    Gravida Para Term Preterm AB Living             0   SAB TAB Ectopic Multiple Live Births                   Home Medications    Prior to Admission medications   Medication Sig Start Date End Date Taking? Authorizing Provider  doxycycline (VIBRAMYCIN) 100 MG capsule Take 1 capsule (100 mg total) by mouth 2 (two) times daily. One po bid x 7 days 05/25/17   Blane Ohara, MD  levothyroxine (SYNTHROID, LEVOTHROID) 75 MCG tablet Take 75 mcg by mouth daily. 03/11/16   [provider]  OXYGEN Inhale 2 L into the lungs at bedtime.     [provider]  Polyethyl Glycol-Propyl Glycol (SYSTANE) 0.4-0.3 % SOLN Place 1 drop into both eyes daily as needed. Dry eyes    [provider]    Family History No family history on file.  Social History Social History  Substance Use Topics  . Smoking status: Never Smoker  . Smokeless tobacco: Never Used  . Alcohol use No     Allergies   Patient has no known allergies.   Review of Systems Review of Systems  Gastrointestinal: Negative for vomiting.  Skin: Positive for color change and wound.       swelling  Neurological: Negative for syncope.  All other systems reviewed and are negative.    Physical Exam Updated Vital Signs BP 105/63 (BP Location: Right Arm)   Pulse 77   Temp 98.2 F (36.8 C) (Oral)   Resp 17   Ht 5\' 3"  (1.6 m)   Wt 53.1 kg (117 lb)   SpO2 99%   BMI 20.73 kg/m   Physical Exam  Constitutional: She is oriented to person, place, and time. She appears well-developed and well-nourished.  HENT:  Head: Normocephalic.  Eyes: EOM are normal.  Neck: Normal range of motion.  Cardiovascular: Normal rate, regular rhythm, normal heart sounds and intact distal pulses.  Exam reveals no gallop and no friction  rub.   No murmur heard. Pulmonary/Chest: Effort normal. No respiratory distress. She has no wheezes. She has no rales. She exhibits no tenderness.  Abdominal: Soft. She exhibits no distension. There is no tenderness.  Musculoskeletal: Normal range of motion.  Neurological: She is alert and oriented to person, place, and time.  Skin:  Erythema and warmth to distal tibia Mild blistering that has opened with clear fluid and is draining 2+ pulses distally in right leg  Psychiatric: She has a normal mood and affect.  Nursing note and vitals reviewed.    ED Treatments / Results   DIAGNOSTIC STUDIES: Oxygen Saturation is 99% on RA, normal by my interpretation.   COORDINATION OF CARE: 1:56 PM-Discussed next steps with pt. Pt  verbalized understanding and is agreeable with the plan.   Labs (all labs ordered are listed, but only abnormal results are displayed) Labs Reviewed - No data to display  EKG  EKG Interpretation None       Radiology No results found.  Procedures Procedures (including critical care time)  Medications Ordered in ED Medications  doxycycline (VIBRA-TABS) tablet 100 mg (not administered)     Initial Impression / Assessment and Plan / ED Course  I have reviewed the triage vital signs and the nursing notes.  Pertinent labs & imaging results that were available during my care of the patient were reviewed by me and considered in my medical decision making (see chart for details).    Patient with recent poor healing wound presents with concern for infection. Patient developed erythema yesterday on blistered area. Patient unable to see primary doctor due to holiday.  Despite age patient is well-appearing has no systemic symptoms and has someone check on her regularly. Plan to take doxycycline and have reassessment in 48 hours. Strict reasons return given.  Results and differential diagnosis were discussed with the patient/parent/guardian. Xrays were independently reviewed by myself.  Close follow up outpatient was discussed, comfortable with the plan.   Medications  doxycycline (VIBRA-TABS) tablet 100 mg (not administered)    Vitals:   05/25/17 1126 05/25/17 1127  BP: 105/63   Pulse: 77   Resp: 17   Temp: 98.2 F (36.8 C)   TempSrc: Oral   SpO2: 99%   Weight:  53.1 kg (117 lb)  Height:  5\' 3"  (1.6 m)    Final diagnoses:  Cellulitis of right leg     Final Clinical Impressions(s) / ED Diagnoses   Final diagnoses:  Cellulitis of right leg    New Prescriptions New Prescriptions   DOXYCYCLINE (VIBRAMYCIN) 100 MG CAPSULE    Take 1 capsule (100 mg total) by mouth 2 (two) times daily. One po bid x 7 days        Blane OharaZavitz, Jazsmin Couse, MD 05/25/17 [phone]

## 2017-05-25 NOTE — ED Notes (Signed)
Wound to right lower leg cleansed with saline and a dry non adherent dressing applied and wrapped with Kurlex.

## 2017-05-25 NOTE — Discharge Instructions (Signed)
See a physician or return to the emergency department if he develop worsening spreading redness, fevers, vomiting, or new concerns  If you were given medicines take as directed.  If you are on coumadin or contraceptives realize their levels and effectiveness is altered by many different medicines.  If you have any reaction (rash, tongues swelling, other) to the medicines stop taking and see a physician.    If your blood pressure was elevated in the ER make sure you follow up for management with a primary doctor or return for chest pain, shortness of breath or stroke symptoms.  Please follow up as directed and return to the ER or see a physician for new or worsening symptoms.  Thank you. Vitals:   05/25/17 1126 05/25/17 1127  BP: 105/63   Pulse: 77   Resp: 17   Temp: 98.2 F (36.8 C)   TempSrc: Oral   SpO2: 99%   Weight:  53.1 kg (117 lb)  Height:  5\' 3"  (1.6 m)   6

## 2018-09-04 IMAGING — CT CT CERVICAL SPINE W/O CM
3 of 11 series · 10 of 33 positions shown, 11 images · non-contrast
Comparison: 05/22/2016 and 03/06/2008 CTs

CLINICAL DATA: [AGE] female with head, face and neck injury
following fall. Initial encounter.

EXAM:
CT HEAD WITHOUT CONTRAST
CT MAXILLOFACIAL WITHOUT CONTRAST
CT CERVICAL SPINE WITHOUT CONTRAST
TECHNIQUE: Multidetector CT imaging of the head, cervical spine, and
maxillofacial structures were performed using the standard protocol
without intravenous contrast. Multiplanar CT image reconstructions
of the cervical spine and maxillofacial structures were also
generated.

[Series 9: max soft · axial · 0.29mm/px · z∈[-12,+94]mm · 4 of 89 slices shown, 5 images]
[im 18/89  soft-tissue]
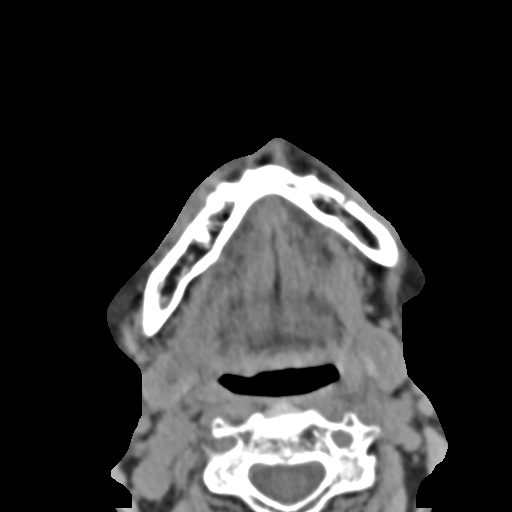
[im 18/89  bone]
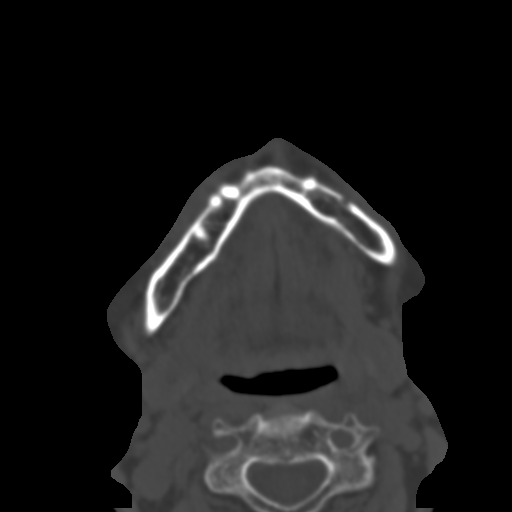
[im 36/89  bone]
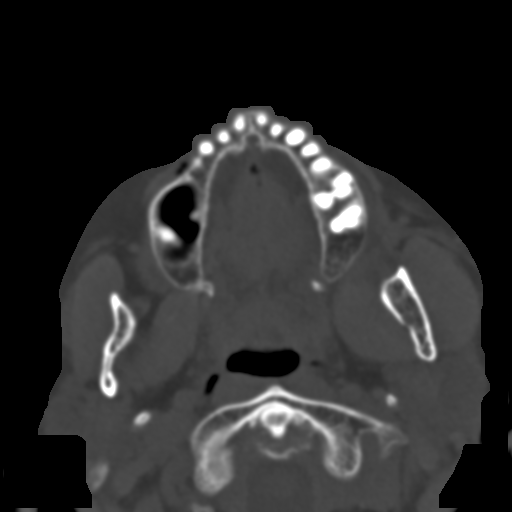
[im 53/89  bone]
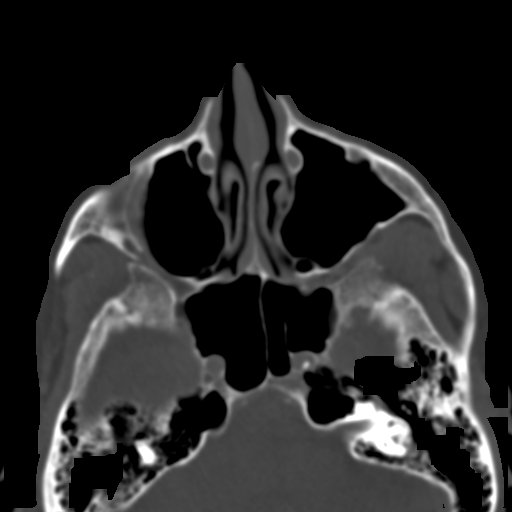
[im 71/89  bone]
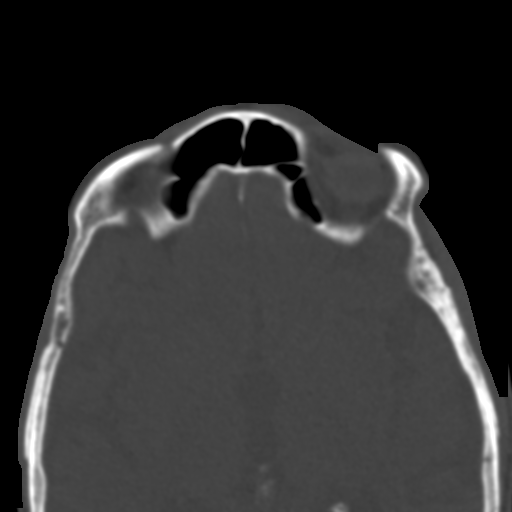

[Series 16: sagittal bone · sagittal · 0.36mm/px · 2 of 83 slices shown]
[im 28/83  bone]
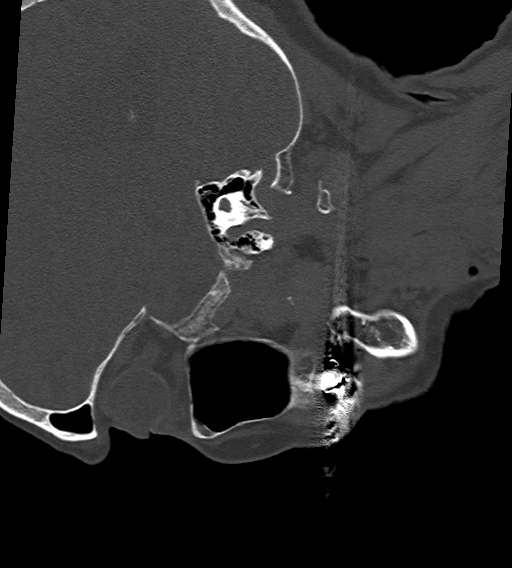
[im 55/83  bone]
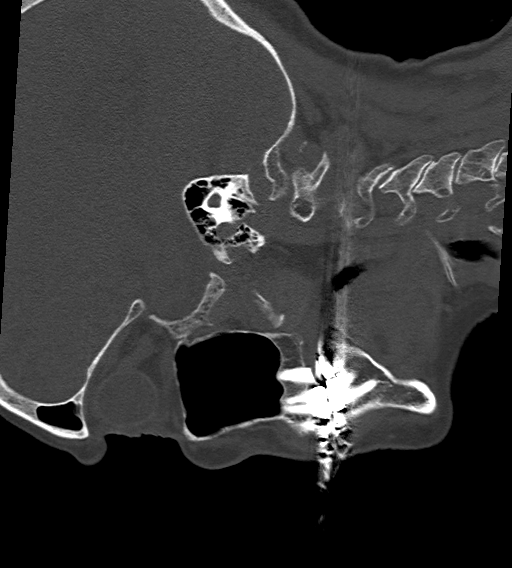

[Series 18: c spine soft · axial · 0.34mm/px · z∈[-78,+18]mm · 4 of 81 slices shown]
[im 17/81  soft-tissue]
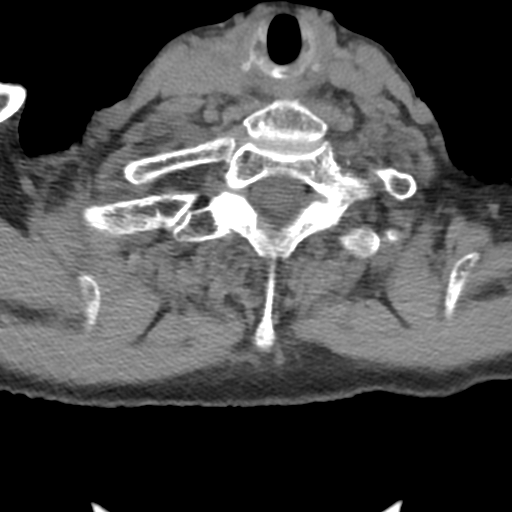
[im 33/81  soft-tissue]
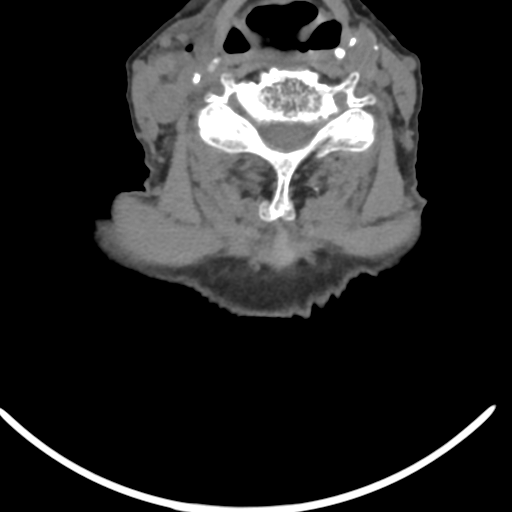
[im 49/81  soft-tissue]
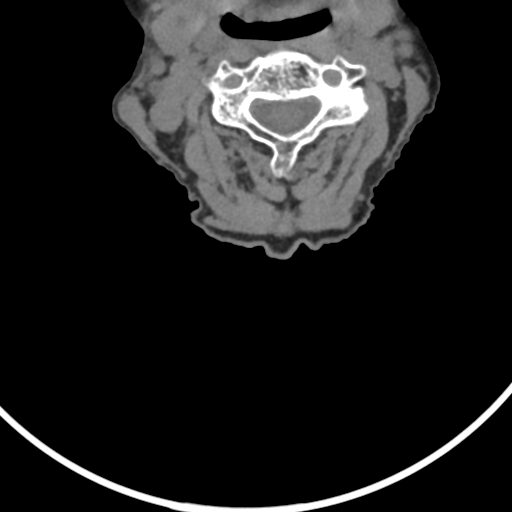
[im 65/81  soft-tissue]
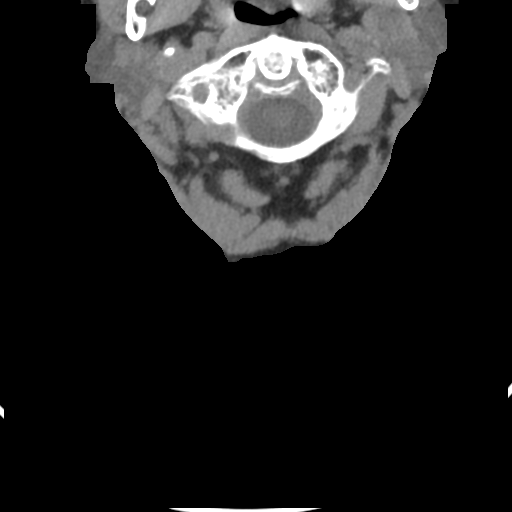

[10 of 33 positions shown; findings below may reference images not displayed]

FINDINGS: CT HEAD FINDINGS

Brain: No evidence of acute infarction, hemorrhage, hydrocephalus,
extra-axial collection or mass lesion/mass effect.

Atrophy and chronic small-vessel white matter ischemic changes again
noted.

Vascular: Intracranial atherosclerotic calcifications noted.

Skull: No acute abnormalities.

Other: None

CT MAXILLOFACIAL FINDINGS

Osseous: No acute fracture, subluxation or dislocation. Degenerative
changes of the TMJ is noted.

Orbits: Negative. No traumatic or inflammatory finding.

Sinuses: Clear.

Soft tissues: Mild soft tissue swelling overlying the chin noted.

CT CERVICAL SPINE FINDINGS

Alignment: No acute subluxation.

Skull base and vertebrae: No acute fracture. No primary bone lesion
or focal pathologic process.

Soft tissues and spinal canal: No prevertebral fluid or swelling. No
visible canal hematoma.

Disc levels: Multilevel degenerative disc disease and spondylosis
noted, moderate at C3-4, C5-6 and C6-7.

Upper chest: Negative.

Other: None
IMPRESSION: No evidence of acute intracranial abnormality. Atrophy and chronic
small-vessel white matter ischemic changes.

Chin soft tissue swelling without acute facial fracture.

No static evidence of acute injury to the cervical spine.
Degenerative changes as described.

## 2023-02-27 DEATH — deceased
